# Patient Record
Sex: Female | Born: 1975 | Race: White | Hispanic: No | Smoking: Never smoker
Health system: Southern US, Community
[De-identification: ages and names within clinical notes are randomized; demographics above are authoritative.]

## PROBLEM LIST (undated history)

## (undated) DIAGNOSIS — J45909 Unspecified asthma, uncomplicated: Secondary | ICD-10-CM

## (undated) DIAGNOSIS — G4730 Sleep apnea, unspecified: Secondary | ICD-10-CM

## (undated) DIAGNOSIS — F419 Anxiety disorder, unspecified: Secondary | ICD-10-CM

## (undated) DIAGNOSIS — E785 Hyperlipidemia, unspecified: Secondary | ICD-10-CM

## (undated) DIAGNOSIS — I10 Essential (primary) hypertension: Secondary | ICD-10-CM

## (undated) DIAGNOSIS — T7840XA Allergy, unspecified, initial encounter: Secondary | ICD-10-CM

## (undated) DIAGNOSIS — E079 Disorder of thyroid, unspecified: Secondary | ICD-10-CM

## (undated) HISTORY — DX: Hyperlipidemia, unspecified: E78.5

## (undated) HISTORY — DX: Unspecified asthma, uncomplicated: J45.909

## (undated) HISTORY — DX: Essential (primary) hypertension: I10

## (undated) HISTORY — DX: Sleep apnea, unspecified: G47.30

## (undated) HISTORY — DX: Allergy, unspecified, initial encounter: T78.40XA

---

## 2015-05-01 ENCOUNTER — Encounter (HOSPITAL_COMMUNITY): Payer: Self-pay | Admitting: *Deleted

## 2015-05-01 ENCOUNTER — Emergency Department (INDEPENDENT_AMBULATORY_CARE_PROVIDER_SITE_OTHER)
Admission: EM | Admit: 2015-05-01 | Discharge: 2015-05-01 | Disposition: A | Discharge status: Home / Self Care | Payer: BLUE CROSS/BLUE SHIELD | Source: Home / Self Care | Attending: Family Medicine | Admitting: Family Medicine

## 2015-05-01 DIAGNOSIS — Z760 Encounter for issue of repeat prescription: Secondary | ICD-10-CM | POA: Diagnosis not present

## 2015-05-01 HISTORY — DX: Disorder of thyroid, unspecified: E07.9

## 2015-05-01 HISTORY — DX: Anxiety disorder, unspecified: F41.9

## 2015-05-01 MED ORDER — VENLAFAXINE HCL ER 75 MG PO CP24: 225.0000 mg | ORAL_CAPSULE | Freq: Every day | ORAL | Status: DC

## 2015-05-01 NOTE — ED Notes (Addendum)
Pt  Presents  With  An empty  Bottle  Of effexor   75 mg  3   In am   Er         Requesting a  refill

## 2015-05-01 NOTE — ED Provider Notes (Signed)
CSN: [id number]     Arrival date & time 05/01/15  1854 History   First MD Initiated Contact with Patient 05/01/15 1923     Chief Complaint  Patient presents with  . Medication Refill   (Consider location/radiation/quality/duration/timing/severity/associated sxs/prior Treatment) Patient is a 39 y.o. female presenting with general illness. The history is provided by the patient.  Illness Severity:  Mild Chronicity:  Chronic Context:  Has run out of effexor prescribed [facility] . been on it for sev yrs., diong well.   Past Medical History  Diagnosis Date  . Anxiety   . Thyroid disease    History reviewed. No pertinent past surgical history. History reviewed. No pertinent family history. History  Substance Use Topics  . Smoking status: Never Smoker   . Smokeless tobacco: Not on file  . Alcohol Use: Yes   OB History    No data available     Review of Systems  Constitutional: Negative.   Psychiatric/Behavioral: Negative.     Allergies  Review of patient's allergies indicates no known allergies.  Home Medications   Prior to Admission medications   Medication Sig Start Date End Date Taking? Authorizing Provider  venlafaxine XR (EFFEXOR-XR) 75 MG 24 hr capsule Take 3 capsules (225 mg total) by mouth daily with breakfast. 05/01/15   Billy Fischer, MD   BP 130/72 mmHg  Pulse 78  Temp(Src) 98.6 F (37 C) (Oral)  Resp 18  SpO2 100% Physical Exam  Constitutional: She is oriented to person, place, and time. She appears well-developed and well-nourished. No distress.  Neurological: She is alert and oriented to person, place, and time.  Skin: Skin is warm and dry.  Nursing note and vitals reviewed.   ED Course  Procedures (including critical care time) Labs Review Labs Reviewed - No data to display  Imaging Review No results found.   MDM   1. Encounter for medication refill        Billy Fischer, MD 05/01/15 Joen Laura

## 2015-05-01 NOTE — Discharge Instructions (Signed)
See your doctor for further refills of your medicine.

## 2020-09-28 ENCOUNTER — Telehealth (HOSPITAL_COMMUNITY): Payer: Self-pay | Admitting: Emergency Medicine

## 2020-09-28 ENCOUNTER — Other Ambulatory Visit: Payer: Self-pay | Admitting: Nurse Practitioner

## 2020-09-28 DIAGNOSIS — U071 COVID-19: Secondary | ICD-10-CM

## 2020-09-28 NOTE — Telephone Encounter (Signed)
Called pt and explained possible monoclonal antibody treatment. Pt is not vaccinated. Pt lives in [place]. Sx started 12/20. Tested positive at home 12/22. Sx include SOB (does not have a pulse ox), chest tightness "like I can't get a good breath," cough, nasal congestion, lethargy, and body aches. She said she does not feel as if her SOB is an emergent situation. Said her nailbeds are pink, and she does not sound SOB on the phone. Qualifying risk factors include asthma, hx bronchitis, intermittent HTN, and BMI 37.8. Pt interested in tx. Informed pt an APP will call back to possibly schedule an appointment. Gave pt insurance CPT code [phone] to call insurance company regarding coverage.

## 2020-09-28 NOTE — Progress Notes (Signed)
I connected by phone with Wendy Pratt on 09/28/2020 at 1:04 PM to discuss the potential use of a new treatment for mild to moderate COVID-19 viral infection in non-hospitalized patients.  This patient is a 44 y.o. female that meets the FDA criteria for Emergency Use Authorization of COVID monoclonal antibody casirivimab/imdevimab, bamlanivimab/etesevimab, or sotrovimab.  Has a (+) direct SARS-CoV-2 viral test result  Has mild or moderate COVID-19   Is NOT hospitalized due to COVID-19  Is within 10 days of symptom onset  Has at least one of the high risk factor(s) for progression to severe COVID-19 and/or hospitalization as defined in EUA.  Specific high risk criteria : BMI > 25, Cardiovascular disease or hypertension and Chronic Lung Disease   I have spoken and communicated the following to the patient or parent/caregiver regarding COVID monoclonal antibody treatment:  1. FDA has authorized the emergency use for the treatment of mild to moderate COVID-19 in adults and pediatric patients with positive results of direct SARS-CoV-2 viral testing who are 74 years of age and older weighing at least 40 kg, and who are at high risk for progressing to severe COVID-19 and/or hospitalization.  2. The significant known and potential risks and benefits of COVID monoclonal antibody, and the extent to which such potential risks and benefits are unknown.  3. Information on available alternative treatments and the risks and benefits of those alternatives, including clinical trials.  4. Patients treated with COVID monoclonal antibody should continue to self-isolate and use infection control measures (e.g., wear mask, isolate, social distance, avoid sharing personal items, clean and disinfect "high touch" surfaces, and frequent handwashing) according to CDC guidelines.   5. The patient or parent/caregiver has the option to accept or refuse COVID monoclonal antibody treatment.  After reviewing this  information with the patient, the patient has agreed to receive one of the available covid 19 monoclonal antibodies and will be provided an appropriate fact sheet prior to infusion. Jobe Gibbon, NP 09/28/2020 1:04 PM

## 2020-09-29 ENCOUNTER — Ambulatory Visit (HOSPITAL_COMMUNITY)
Admission: RE | Admit: 2020-09-29 | Discharge: 2020-09-29 | Disposition: A | Discharge status: Home / Self Care | Payer: BC Managed Care – PPO | Source: Ambulatory Visit | Attending: Pulmonary Disease | Admitting: Pulmonary Disease

## 2020-09-29 DIAGNOSIS — U071 COVID-19: Secondary | ICD-10-CM | POA: Diagnosis not present

## 2020-09-29 MED ORDER — EPINEPHRINE 0.3 MG/0.3ML IJ SOAJ: 0.3000 mg | Freq: Once | INTRAMUSCULAR | Status: DC | PRN

## 2020-09-29 MED ORDER — SODIUM CHLORIDE 0.9 % IV SOLN: Freq: Once | INTRAVENOUS | Status: AC

## 2020-09-29 MED ORDER — ALBUTEROL SULFATE HFA 108 (90 BASE) MCG/ACT IN AERS: 2.0000 | INHALATION_SPRAY | Freq: Once | RESPIRATORY_TRACT | Status: DC | PRN

## 2020-09-29 MED ORDER — FAMOTIDINE IN NACL 20-0.9 MG/50ML-% IV SOLN: 20.0000 mg | Freq: Once | INTRAVENOUS | Status: DC | PRN

## 2020-09-29 MED ORDER — DIPHENHYDRAMINE HCL 50 MG/ML IJ SOLN: 50.0000 mg | Freq: Once | INTRAMUSCULAR | Status: DC | PRN

## 2020-09-29 MED ORDER — METHYLPREDNISOLONE SODIUM SUCC 125 MG IJ SOLR: 125.0000 mg | Freq: Once | INTRAMUSCULAR | Status: DC | PRN

## 2020-09-29 MED ORDER — SODIUM CHLORIDE 0.9 % IV SOLN: INTRAVENOUS | Status: DC | PRN

## 2020-09-29 NOTE — Discharge Instructions (Signed)
10 Things You Can Do to Manage Your COVID-19 Symptoms at Home If you have possible or confirmed COVID-19: 1. Stay home from work and school. And stay away from other public places. If you must go out, avoid using any kind of public transportation, ridesharing, or taxis. 2. Monitor your symptoms carefully. If your symptoms get worse, call your healthcare provider immediately. 3. Get rest and stay hydrated. 4. If you have a medical appointment, call the healthcare provider ahead of time and tell them that you have or may have COVID-19. 5. For medical emergencies, call 911 and notify the dispatch personnel that you have or may have COVID-19. 6. Cover your cough and sneezes with a tissue or use the inside of your elbow. 7. Wash your hands often with soap and water for at least 20 seconds or clean your hands with an alcohol-based hand sanitizer that contains at least 60% alcohol. 8. As much as possible, stay in a specific room and away from other people in your home. Also, you should use a separate bathroom, if available. If you need to be around other people in or outside of the home, wear a mask. 9. Avoid sharing personal items with other people in your household, like dishes, towels, and bedding. 10. Clean all surfaces that are touched often, like counters, tabletops, and doorknobs. Use household cleaning sprays or wipes according to the label instructions. cdc.gov/coronavirus 04/08/2019 This information is not intended to replace advice given to you by your health care provider. Make sure you discuss any questions you have with your health care provider. Document Revised: 09/10/2019 Document Reviewed: 09/10/2019 Elsevier Patient Education  2020 Elsevier Inc. What types of side effects do monoclonal antibody drugs cause?  Common side effects  In general, the more common side effects caused by monoclonal antibody drugs include: . Allergic reactions, such as hives or itching . Flu-like signs and  symptoms, including chills, fatigue, fever, and muscle aches and pains . Nausea, vomiting . Diarrhea . Skin rashes . Low blood pressure   The CDC is recommending patients who receive monoclonal antibody treatments wait at least 90 days before being vaccinated.  Currently, there are no data on the safety and efficacy of mRNA COVID-19 vaccines in persons who received monoclonal antibodies or convalescent plasma as part of COVID-19 treatment. Based on the estimated half-life of such therapies as well as evidence suggesting that reinfection is uncommon in the 90 days after initial infection, vaccination should be deferred for at least 90 days, as a precautionary measure until additional information becomes available, to avoid interference of the antibody treatment with vaccine-induced immune responses. If you have any questions or concerns after the infusion please call the Advanced Practice Provider on call at [phone]. This number is ONLY intended for your use regarding questions or concerns about the infusion post-treatment side-effects.  Please do not provide this number to others for use. For return to work notes please contact your primary care provider.   If someone you know is interested in receiving treatment please have them call the COVID hotline at [phone].   

## 2020-09-29 NOTE — Progress Notes (Addendum)
Patient reviewed Fact Sheet for Patients, Parents, and Caregivers for Emergency Use Authorization (EUA) of casirivimab/imdevimab for the Treatment of Coronavirus.  Patient also reviewed and is agreeable to the estimated cost of treatment.  Patient is agreeable to proceed.  

## 2020-09-29 NOTE — Progress Notes (Addendum)
Note:  Wendy Pratt had mild "burning sensation" 3/10 approximately 12:15 pm. Notes mild shortness of breath.  Hx of asthma; nebulizer last used last night. She took some breaths and feels much better; believes she's having some anxiety wearing the mask, as she's not used to it. She states she feels okay to drive home and will take nebulizer treatment; does not have inhaler on hand.    Diagnosis: COVID-19  Physician:  Dr. Asencion Noble   Procedure: [facility] Med: casirivimab\imdevimab infusion - Provided patient with casirivimab\imdevimab fact sheet for patients, parents and caregivers prior to infusion.  Complications: No immediate complications noted.  Discharge: Discharged home   Monna Fam 09/29/2020

## 2021-10-30 ENCOUNTER — Emergency Department (HOSPITAL_BASED_OUTPATIENT_CLINIC_OR_DEPARTMENT_OTHER): Payer: BC Managed Care – PPO

## 2021-10-30 ENCOUNTER — Other Ambulatory Visit: Payer: Self-pay

## 2021-10-30 ENCOUNTER — Other Ambulatory Visit (HOSPITAL_BASED_OUTPATIENT_CLINIC_OR_DEPARTMENT_OTHER): Payer: Self-pay

## 2021-10-30 ENCOUNTER — Encounter (HOSPITAL_BASED_OUTPATIENT_CLINIC_OR_DEPARTMENT_OTHER): Payer: Self-pay | Admitting: *Deleted

## 2021-10-30 ENCOUNTER — Emergency Department (HOSPITAL_BASED_OUTPATIENT_CLINIC_OR_DEPARTMENT_OTHER)
Admission: EM | Admit: 2021-10-30 | Discharge: 2021-10-30 | Disposition: A | Discharge status: Home / Self Care | Payer: BC Managed Care – PPO | Attending: Emergency Medicine | Admitting: Emergency Medicine

## 2021-10-30 DIAGNOSIS — R0789 Other chest pain: Secondary | ICD-10-CM | POA: Insufficient documentation

## 2021-10-30 DIAGNOSIS — E039 Hypothyroidism, unspecified: Secondary | ICD-10-CM | POA: Insufficient documentation

## 2021-10-30 DIAGNOSIS — K625 Hemorrhage of anus and rectum: Secondary | ICD-10-CM

## 2021-10-30 DIAGNOSIS — R1084 Generalized abdominal pain: Secondary | ICD-10-CM | POA: Insufficient documentation

## 2021-10-30 DIAGNOSIS — K649 Unspecified hemorrhoids: Secondary | ICD-10-CM

## 2021-10-30 DIAGNOSIS — K644 Residual hemorrhoidal skin tags: Secondary | ICD-10-CM | POA: Insufficient documentation

## 2021-10-30 DIAGNOSIS — R109 Unspecified abdominal pain: Secondary | ICD-10-CM | POA: Diagnosis present

## 2021-10-30 LAB — COMPREHENSIVE METABOLIC PANEL
ALT: 46 U/L — ABNORMAL HIGH (ref 0–44)
AST: 30 U/L (ref 15–41)
Albumin: 4.1 g/dL (ref 3.5–5.0)
Alkaline Phosphatase: 70 U/L (ref 38–126)
Anion gap: 9 (ref 5–15)
BUN: 13 mg/dL (ref 6–20)
CO2: 24 mmol/L (ref 22–32)
Calcium: 8.7 mg/dL — ABNORMAL LOW (ref 8.9–10.3)
Chloride: 103 mmol/L (ref 98–111)
Creatinine, Ser: 0.72 mg/dL (ref 0.44–1.00)
GFR, Estimated: >60 mL/min (ref >60)
Glucose, Bld: 93 mg/dL (ref 70–99)
Potassium: 3.7 mmol/L (ref 3.5–5.1)
Sodium: 136 mmol/L (ref 135–145)
Total Bilirubin: 0.8 mg/dL (ref 0.3–1.2)
Total Protein: 6.8 g/dL (ref 6.5–8.1)

## 2021-10-30 LAB — URINALYSIS, ROUTINE W REFLEX MICROSCOPIC
Bilirubin Urine: NEGATIVE
Glucose, UA: NEGATIVE mg/dL
Hgb urine dipstick: NEGATIVE
Ketones, ur: NEGATIVE mg/dL
Leukocytes,Ua: NEGATIVE
Nitrite: NEGATIVE
Protein, ur: NEGATIVE mg/dL
Specific Gravity, Urine: 1.015 (ref 1.005–1.030)
pH: 6.5 (ref 5.0–8.0)

## 2021-10-30 LAB — CBC WITH DIFFERENTIAL/PLATELET
Abs Immature Granulocytes: 0.02 10*3/uL (ref 0.00–0.07)
Basophils Absolute: 0.1 10*3/uL (ref 0.0–0.1)
Basophils Relative: 1 %
Eosinophils Absolute: 0.1 10*3/uL (ref 0.0–0.5)
Eosinophils Relative: 1 %
HCT: 36.1 % (ref 36.0–46.0)
Hemoglobin: 12.8 g/dL (ref 12.0–15.0)
Immature Granulocytes: 0 %
Lymphocytes Relative: 28 %
Lymphs Abs: 1.7 10*3/uL (ref 0.7–4.0)
MCH: 31 pg (ref 26.0–34.0)
MCHC: 35.5 g/dL (ref 30.0–36.0)
MCV: 87.4 fL (ref 80.0–100.0)
Monocytes Absolute: 0.6 10*3/uL (ref 0.1–1.0)
Monocytes Relative: 10 %
Neutro Abs: 3.7 10*3/uL (ref 1.7–7.7)
Neutrophils Relative %: 60 %
Platelets: 262 10*3/uL (ref 150–400)
RBC: 4.13 MIL/uL (ref 3.87–5.11)
RDW: 12.1 % (ref 11.5–15.5)
WBC: 6.2 10*3/uL (ref 4.0–10.5)
nRBC: 0 % (ref 0.0–0.2)

## 2021-10-30 LAB — PREGNANCY, URINE: Preg Test, Ur: NEGATIVE

## 2021-10-30 LAB — LIPASE, BLOOD: Lipase: 33 U/L (ref 11–51)

## 2021-10-30 LAB — OCCULT BLOOD X 1 CARD TO LAB, STOOL: Fecal Occult Bld: POSITIVE — AB

## 2021-10-30 MED ORDER — DICYCLOMINE HCL 20 MG PO TABS
20.0000 mg | ORAL_TABLET | Freq: Three times a day (TID) | ORAL | 0 refills | Status: DC | PRN
  Filled 2021-10-30: qty 20, 7d supply, fill #0

## 2021-10-30 MED ORDER — SODIUM CHLORIDE 0.9 % IV BOLUS
1000.0000 mL | Freq: Once | INTRAVENOUS | Status: AC
  Administered 2021-10-30: 1000 mL via INTRAVENOUS

## 2021-10-30 MED ORDER — KETOROLAC TROMETHAMINE 30 MG/ML IJ SOLN
30.0000 mg | Freq: Once | INTRAMUSCULAR | Status: AC
  Administered 2021-10-30: 30 mg via INTRAVENOUS
  Filled 2021-10-30: qty 1

## 2021-10-30 MED ORDER — MORPHINE SULFATE (PF) 4 MG/ML IV SOLN
4.0000 mg | Freq: Once | INTRAVENOUS | Status: AC
  Administered 2021-10-30: 4 mg via INTRAVENOUS
  Filled 2021-10-30: qty 1

## 2021-10-30 MED ORDER — IOHEXOL 350 MG/ML SOLN
100.0000 mL | Freq: Once | INTRAVENOUS | Status: AC | PRN
  Administered 2021-10-30: 125 mL via INTRAVENOUS

## 2021-10-30 MED ORDER — HYDROCODONE-ACETAMINOPHEN 5-325 MG PO TABS
1.0000 | ORAL_TABLET | ORAL | 0 refills | Status: AC | PRN
  Filled 2021-10-30: qty 10, 2d supply, fill #0

## 2021-10-30 MED ORDER — ONDANSETRON HCL 4 MG/2ML IJ SOLN
4.0000 mg | Freq: Once | INTRAMUSCULAR | Status: AC
Start: 2021-10-30 — End: 2021-10-30
  Administered 2021-10-30: 4 mg via INTRAVENOUS
  Filled 2021-10-30: qty 2

## 2021-10-30 MED ORDER — HYDROCORTISONE (PERIANAL) 2.5 % EX CREA
1.0000 "application " | TOPICAL_CREAM | Freq: Two times a day (BID) | CUTANEOUS | 0 refills | Status: AC
  Filled 2021-10-30: qty 28, 10d supply, fill #0

## 2021-10-30 NOTE — ED Provider Notes (Signed)
[facility] EMERGENCY DEPARTMENT Provider Note   CSN: [id number] Arrival date & time: 10/30/21  1233     History  Chief Complaint  Patient presents with   Abdominal Pain   Rectal Bleeding    Wendy Pratt is a 46 y.o. female.  Pt is a 46 yo wf with a hx of anxiety and hypothyroidism.  She said she has had intermittent rectal bleeding due to hemorrhoids for several years.  She said it usually does not hurt.  Yesterday, she started having back pain, abd pain, and increased bleeding.  She said she is getting a lot of cramps.  She has never seen GI or had a colonoscopy.  She is sure the blood is from her rectum.      Home Medications Prior to Admission medications   Medication Sig Start Date End Date Taking? Authorizing Provider  dicyclomine (BENTYL) 20 MG tablet Take 1 tablet (20 mg total) by mouth 3 (three) times daily as needed for spasms. 10/30/21  Yes Isla Pence, MD  DULoxetine (CYMBALTA) 60 MG capsule Take 1 capsule by mouth daily. 05/22/21  Yes [provider]  HYDROcodone-acetaminophen (NORCO/VICODIN) 5-325 MG tablet Take 1 tablet by mouth every 4 (four) hours as needed. 10/30/21  Yes Isla Pence, MD  hydrocortisone (ANUSOL-HC) 2.5 % rectal cream Place 1 application rectally 2 (two) times daily. 10/30/21  Yes Isla Pence, MD  venlafaxine (EFFEXOR) 75 MG tablet Take 75 mg by mouth 3 (three) times daily with meals.    [provider]  venlafaxine XR (EFFEXOR-XR) 75 MG 24 hr capsule Take 3 capsules (225 mg total) by mouth daily with breakfast. 05/01/15   Kindl, Nelda Severe, MD      Allergies    Patient has no known allergies.    Review of Systems   Review of Systems  Gastrointestinal:  Positive for blood in stool.  All other systems reviewed and are negative.  Physical Exam Updated Vital Signs BP 111/74 (BP Location: Right Arm)    Pulse 94    Temp 98.2 F (36.8 C) (Oral)    Resp 18    Ht 5\' 4"  (1.626 m)    Wt 127 kg    SpO2 97%    BMI 48.06  kg/m  Physical Exam Vitals and nursing note reviewed. Exam conducted with a chaperone present.  Constitutional:      Appearance: She is well-developed.  HENT:     Head: Normocephalic and atraumatic.     Mouth/Throat:     Mouth: Mucous membranes are moist.     Pharynx: Oropharynx is clear.  Eyes:     Extraocular Movements: Extraocular movements intact.     Pupils: Pupils are equal, round, and reactive to light.  Cardiovascular:     Rate and Rhythm: Normal rate and regular rhythm.     Heart sounds: Normal heart sounds.  Pulmonary:     Effort: Pulmonary effort is normal.     Breath sounds: Normal breath sounds.  Abdominal:     General: Abdomen is flat. Bowel sounds are normal.     Palpations: Abdomen is soft.     Tenderness: There is generalized abdominal tenderness.  Genitourinary:    Rectum: Guaiac result positive. Tenderness and external hemorrhoid present.  Skin:    General: Skin is warm.     Capillary Refill: Capillary refill takes less than 2 seconds.  Neurological:     General: No focal deficit present.     Mental Status: She is alert and  oriented to person, place, and time.  Psychiatric:        Mood and Affect: Mood normal.        Behavior: Behavior normal.    ED Results / Procedures / Treatments   Labs (all labs ordered are listed, but only abnormal results are displayed) Labs Reviewed  COMPREHENSIVE METABOLIC PANEL - Abnormal; Notable for the following components:      Result Value   Calcium 8.7 (*)    ALT 46 (*)    All other components within normal limits  OCCULT BLOOD X 1 CARD TO LAB, STOOL - Abnormal; Notable for the following components:   Fecal Occult Bld POSITIVE (*)    All other components within normal limits  CBC WITH DIFFERENTIAL/PLATELET  LIPASE, BLOOD  URINALYSIS, ROUTINE W REFLEX MICROSCOPIC  PREGNANCY, URINE    EKG None  Radiology CT Angio Abd/Pel W and/or Wo Contrast  Result Date: 10/30/2021 CLINICAL DATA:  46 year old female with  history of abdominal pain after episode of rectal bleeding. EXAM: CT ANGIOGRAPHY ABDOMEN AND PELVIS WITH CONTRAST AND WITHOUT CONTRAST TECHNIQUE: Multidetector CT imaging of the abdomen and pelvis was performed using the standard protocol during bolus administration of intravenous contrast. Multiplanar reconstructed images and MIPs were obtained and reviewed to evaluate the vascular anatomy. RADIATION DOSE REDUCTION: This exam was performed according to the departmental dose-optimization program which includes automated exposure control, adjustment of the mA and/or kV according to patient size and/or use of iterative reconstruction technique. CONTRAST:  167mL OMNIPAQUE IOHEXOL 350 MG/ML SOLN COMPARISON:  03/15/2021 FINDINGS: VASCULAR Aorta: Normal caliber aorta without aneurysm, dissection, vasculitis or significant stenosis. Celiac: Patent without evidence of aneurysm, dissection, vasculitis or significant stenosis. SMA: Patent without evidence of aneurysm, dissection, vasculitis or significant stenosis. Renals: Single bilateral renal arteries are patent without evidence of aneurysm, dissection, vasculitis, fibromuscular dysplasia or significant stenosis. IMA: Patent without evidence of aneurysm, dissection, vasculitis or significant stenosis. Inflow: Patent without evidence of aneurysm, dissection, vasculitis or significant stenosis. Proximal Outflow: Bilateral common femoral and visualized portions of the superficial and profunda femoral arteries are patent without evidence of aneurysm, dissection, vasculitis or significant stenosis. Veins: The hepatic veins are widely patent. The portal system is widely patent and normal in caliber. The renal veins are patent bilaterally in standard anatomic configuration. No evidence of iliocaval thrombosis or anomaly. Review of the MIP images confirms the above findings. NON-VASCULAR Lower chest: No acute abnormality. Hepatobiliary: Profound lead decreased attenuation of the  hepatic parenchyma. Smooth contour. No evidence of hepatoma. The gallbladder surgically absent. Pancreas: Unremarkable. No pancreatic ductal dilatation or surrounding inflammatory changes. Spleen: No splenic injury or perisplenic hematoma. Adrenals/Urinary Tract: Adrenal glands are unremarkable. Kidneys are normal, without renal calculi, focal lesion, or hydronephrosis. Bladder is unremarkable. Stomach/Bowel: Stomach is within normal limits. Appendix appears normal. No evidence of bowel wall thickening, distention, or inflammatory changes. Lymphatic: No abdominopelvic lymphadenopathy. Reproductive: Uterus and bilateral adnexa are within normal limits. Intrauterine device in place which appears well positioned. Other: No abdominal wall hernia or abnormality. No abdominopelvic ascites. Musculoskeletal: No acute or significant osseous findings. IMPRESSION: VASCULAR No evidence of acute gastrointestinal hemorrhage. NON-VASCULAR 1. Severe hepatic steatosis. 2. No acute abdominopelvic abnormality. Ruthann Cancer, MD [facility] Radiology Electronically Signed   By: Ruthann Cancer M.D.   On: 10/30/2021 14:55    Procedures Procedures    Medications Ordered in ED Medications  sodium chloride 0.9 % bolus 1,000 mL (1,000 mLs Intravenous New Bag/Given 10/30/21 1343)  ondansetron (ZOFRAN) injection 4 mg (4 mg Intravenous Given 10/30/21 1341)  ketorolac (TORADOL) 30 MG/ML injection 30 mg (30 mg Intravenous Given 10/30/21 1342)  iohexol (OMNIPAQUE) 350 MG/ML injection 100 mL (125 mLs Intravenous Contrast Given 10/30/21 1430)  morphine 4 MG/ML injection 4 mg (4 mg Intravenous Given 10/30/21 1425)    ED Course/ Medical Decision Making/ A&P                           Medical Decision Making Amount and/or Complexity of Data Reviewed Labs: ordered. Radiology: ordered.  Risk Prescription drug management.   Pt has large external hemorrhoids and I suspect she also has  internal hemorrhoids.  However, with the abd pain; I ordered a ct abd/pelvis.  Labs and scans were reviewed by me.  Labs are all normal.  Pt's CT showed a fatty liver, but no evidence of acute gi hemorrhage.   Pt is referred to GI.  She is started on anusol for the hemorrhoids.  She is put on bentyl and lortab for the abd pain.  She is to increase fiber in diet.    Pt is stable for d/c.  Return if worse.        Final Clinical Impression(s) / ED Diagnoses Final diagnoses:  Rectal bleeding  Generalized abdominal pain  Hemorrhoids, unspecified hemorrhoid type    Rx / DC Orders ED Discharge Orders          Ordered    Ambulatory referral to Gastroenterology        10/30/21 1511    hydrocortisone (ANUSOL-HC) 2.5 % rectal cream  2 times daily        10/30/21 1512    dicyclomine (BENTYL) 20 MG tablet  3 times daily PRN        10/30/21 1512    HYDROcodone-acetaminophen (NORCO/VICODIN) 5-325 MG tablet  Every 4 hours PRN        10/30/21 1512              Isla Pence, MD 10/30/21 [phone]

## 2021-10-30 NOTE — ED Triage Notes (Signed)
Rectal bleeding. Hx of hemorrhoids. Back pain yesterday. This am she had bright red blood in the toilet. Abdominal pain this am after an episode of bleeding.

## 2021-11-01 ENCOUNTER — Encounter: Payer: Self-pay | Admitting: Internal Medicine

## 2021-11-01 ENCOUNTER — Ambulatory Visit (INDEPENDENT_AMBULATORY_CARE_PROVIDER_SITE_OTHER): Payer: BC Managed Care – PPO | Admitting: Internal Medicine

## 2021-11-01 VITALS — BP 122/76 | HR 73 | Ht 64.0 in | Wt 281.0 lb

## 2021-11-01 DIAGNOSIS — K6289 Other specified diseases of anus and rectum: Secondary | ICD-10-CM

## 2021-11-01 DIAGNOSIS — K760 Fatty (change of) liver, not elsewhere classified: Secondary | ICD-10-CM

## 2021-11-01 DIAGNOSIS — K625 Hemorrhage of anus and rectum: Secondary | ICD-10-CM

## 2021-11-01 MED ORDER — RECTICARE ADVANCED 5-0.25-17-39 % EX CREA: TOPICAL_CREAM | CUTANEOUS | 0 refills | Status: AC

## 2021-11-01 NOTE — Progress Notes (Signed)
Chief Complaint: Rectal bleeding, bloating  HPI : 46 year old female with history of asthma, fibromyalgia, OSA, obesity, GERD, anxiety, and hypothyroidism presents with rectal bleeding  She has had intermittent rectal bleeding for about 4 years, but more recently has had more significant rectal bleeding that has been concerning.  She has noted some blood clots with the passage of her stools and sees blood fill her toilet bowl. Endorses rectal discomfort and ab pain that is constant. Her ab pain is mostly located in the RLQ. She has not had a BM for the last 2 days after she left the ED. She went to the ED on 10/30/21 and was told that she had external and internal hemorrhoids. She was encouraged to use Bentyl and hydrocortisone cream, and was also prescribed some Norco, which she has been using PRN. She states that her stools are normally soft and flat appearing. Denies use of blood thinners. Endorses use of meloxicam once a day. She tried the hydrocortisone cream that she feels like has not helped that much. Denies prior colonoscopy . Grandmother had colon caner in her 60-70s. She takes 2 omeprazole every day that helps with her GERD. On average she has one BM per day prior to going to the ED.   Past Medical History:  Diagnosis Date   Anxiety    Thyroid disease      Past Surgical History:  Procedure Laterality Date   BUNIONECTOMY Right    right foot   CHOLECYSTECTOMY     Family History  Problem Relation Age of Onset   Hypothyroidism Mother    High Cholesterol Mother    Colon cancer Maternal Grandmother    Breast cancer Paternal Grandmother    Rectal cancer Neg Hx    Stomach cancer Neg Hx    Esophageal cancer Neg Hx    Pancreatic cancer Neg Hx    Liver disease Neg Hx    Social History   Tobacco Use   Smoking status: Never   Smokeless tobacco: Never  Vaping Use   Vaping Use: Never used  Substance Use Topics   Alcohol use: Not Currently   Drug use: Never   Current  Outpatient Medications  Medication Sig Dispense Refill   Cholecalciferol 25 MCG (1000 UT) tablet Take by mouth.     dicyclomine (BENTYL) 20 MG tablet Take 1 tablet (20 mg total) by mouth 3 (three) times daily as needed for spasms. 20 tablet 0   DULoxetine (CYMBALTA) 60 MG capsule Take 1 capsule by mouth daily.     HYDROcodone-acetaminophen (NORCO/VICODIN) 5-325 MG tablet Take 1 tablet by mouth every 4 (four) hours as needed. 10 tablet 0   hydrocortisone (ANUSOL-HC) 2.5 % rectal cream Place 1 application rectally 2 (two) times daily. 28 g 0   levothyroxine (SYNTHROID) 150 MCG tablet 1 tablet from Monday to Saturday. Take only 1/2 tablet every Sunday.     Lido-PE-Mineral Oil-Petrolatum (RECTICARE ADVANCED) 5-0.25-17-39 % CREA Use as needed 3 g 0   No current facility-administered medications for this visit.   No Known Allergies   Review of Systems: All systems reviewed and negative except where noted in HPI.   Physical Exam: BP 122/76    Pulse 73    Ht 5\' 4"  (1.626 m)    Wt 281 lb (127.5 kg)    SpO2 99%    BMI 48.23 kg/m  Constitutional: Pleasant,well-developed, female in no acute distress. HEENT: Normocephalic and atraumatic. Conjunctivae are normal. No scleral icterus. Cardiovascular: Normal rate,  regular rhythm.  Pulmonary/chest: Effort normal and breath sounds normal. No wheezing, rales or rhonchi. Abdominal: Soft, nondistended, nontender. Bowel sounds active throughout. There are no masses palpable. No hepatomegaly. Rectal: With Recticare, was able to perform inspection. No masses felt on rectal exam. Has some prolapse of internal hemorrhoids. Anoscopy revealed several columns of internal hemorrhoids. Extremities: No edema Neurological: Alert and oriented to person place and time. Skin: Skin is warm and dry. No rashes noted. Psychiatric: Normal mood and affect. Behavior is normal.  Labs 10/2021: CMP with elevated ALT of 46. CBC unremarkable. Lipase nml.  FOBT positive  CTA A/P  10/30/21: IMPRESSION: VASCULAR No evidence of acute gastrointestinal hemorrhage. NON-VASCULAR 1. Severe hepatic steatosis. 2. No acute abdominopelvic abnormality  ASSESSMENT AND PLAN:  Rectal bleeding Rectal pain Lower abdominal pain Fatty liver Patient presents with worsening issues with rectal bleeding recently.  Unclear what is caused her worsening rectal bleeding, as patient does not describe significant constipation at a baseline.  Her rectal exam reveals prolapsed internal hemorrhoids that may account for pain.  However, would want to rule out malignancy or IBD by performing a colonoscopy for further evaluation. - Recticare PRN - Continue Anusol HC cream prescribed by ED - Recommend avoiding use of narcotic medications if possible - Colonoscopy [facility] - RTC 1 month for consideration of hemorrhoidal banding and work up of fatty liver  Christia Reading, MD  I spent 63 minutes of time, including in depth chart review, independent review of results as outlined above, communicating results with the patient directly, face-to-face time with the patient, coordinating care, ordering studies and medications as appropriate, and documentation.

## 2021-11-01 NOTE — Patient Instructions (Addendum)
If you are age 45 or older, your body mass index should be between 23-30. Your Body mass index is 48.23 kg/m. If this is out of the aforementioned range listed, please consider follow up with your Primary Care Provider.  If you are age 79 or younger, your body mass index should be between 19-25. Your Body mass index is 48.23 kg/m. If this is out of the aformentioned range listed, please consider follow up with your Primary Care Provider.   ________________________________________________________  The  GI providers would like to encourage you to use [facility] to communicate with providers for non-urgent requests or questions.  Due to long hold times on the telephone, sending your provider a message by [facility] may be a faster and more efficient way to get a response.  Please allow 48 business hours for a response.  Please remember that this is for non-urgent requests.  _______________________________________________________  Dennis Bast have been scheduled for a colonoscopy on Monday, 1-30 at 4:00 pm. Please follow written instructions given to you at your visit today.  Please pick up your prep supplies at the pharmacy within the next 1-3 days. If you use inhalers (even only as needed), please bring them with you on the day of your procedure.  We are giving you samples of Recticare today to use as needed.  We have scheduled you for a follow up appointment on Thursday, 2-23 at 10:30 am  Thank you for entrusting me with your care and for choosing [facility], Dr. Christia Reading

## 2021-11-06 ENCOUNTER — Ambulatory Visit (AMBULATORY_SURGERY_CENTER): Payer: BC Managed Care – PPO | Admitting: Gastroenterology

## 2021-11-06 ENCOUNTER — Encounter: Payer: Self-pay | Admitting: Gastroenterology

## 2021-11-06 VITALS — BP 103/72 | HR 72 | Temp 98.6°F | Resp 18 | Ht 64.0 in | Wt 281.0 lb

## 2021-11-06 DIAGNOSIS — K635 Polyp of colon: Secondary | ICD-10-CM

## 2021-11-06 DIAGNOSIS — D123 Benign neoplasm of transverse colon: Secondary | ICD-10-CM

## 2021-11-06 DIAGNOSIS — K625 Hemorrhage of anus and rectum: Secondary | ICD-10-CM

## 2021-11-06 DIAGNOSIS — K648 Other hemorrhoids: Secondary | ICD-10-CM

## 2021-11-06 MED ORDER — SODIUM CHLORIDE 0.9 % IV SOLN: 500.0000 mL | Freq: Once | INTRAVENOUS | Status: DC

## 2021-11-06 MED ORDER — HYDROCORTISONE ACETATE 25 MG RE SUPP: 25.0000 mg | Freq: Two times a day (BID) | RECTAL | 1 refills | Status: AC

## 2021-11-06 NOTE — Progress Notes (Signed)
History and Physical Interval Note: Patient seen in the office 11/01/21 for rectal bleeding / pain, patient of Dr. Lorenso Courier. Bleeding intermittently for some years thought to be due to hemorrhoids per patient, more recently bleeding got worse with anal / rectal pain. Denies much straining. Colonoscopy to further evaluate. Have discussed risks / benefits and she wishes to proceed. Last seen in the office 11/01/21, no interval changes otherwise.   11/06/2021 10:47 AM  Wendy Pratt  has presented today for endoscopic procedure(s), with the diagnosis of  Encounter Diagnosis  Name Primary?   Rectal bleeding Yes  .  The various methods of evaluation and treatment have been discussed with the patient and/or family. After consideration of risks, benefits and other options for treatment, the patient has consented to  the endoscopic procedure(s).   The patient's history has been reviewed, patient examined, no change in status, stable for surgery.  I have reviewed the patient's chart and labs.  Questions were answered to the patient's satisfaction.    Jolly Mango, MD [facility]

## 2021-11-06 NOTE — Patient Instructions (Addendum)
Handouts on polys, and Hemorrhoids provided   Start Anusol suppository twice daily for one week  - Start fiber supplement daily (Citrucel or Metamucil)  - Sitz baths as needed  - Await pathology results.  - Follow up in the office with Dr. Lorenso Courier for consideration of hemorrhoid banding pending patient's course YOU HAD AN ENDOSCOPIC PROCEDURE TODAY AT [facility]:   Refer to the procedure report that was given to you for any specific questions about what was found during the examination.  If the procedure report does not answer your questions, please call your gastroenterologist to clarify.  If you requested that your care partner not be given the details of your procedure findings, then the procedure report has been included in a sealed envelope for you to review at your convenience later.  YOU SHOULD EXPECT: Some feelings of bloating in the abdomen. Passage of more gas than usual.  Walking can help get rid of the air that was put into your GI tract during the procedure and reduce the bloating. If you had a lower endoscopy (such as a colonoscopy or flexible sigmoidoscopy) you may notice spotting of blood in your stool or on the toilet paper. If you underwent a bowel prep for your procedure, you may not have a normal bowel movement for a few days.  Please Note:  You might notice some irritation and congestion in your nose or some drainage.  This is from the oxygen used during your procedure.  There is no need for concern and it should clear up in a day or so.  SYMPTOMS TO REPORT IMMEDIATELY:  Following lower endoscopy (colonoscopy or flexible sigmoidoscopy):  Excessive amounts of blood in the stool  Significant tenderness or worsening of abdominal pains  Swelling of the abdomen that is new, acute  Fever of 100F or higher  For urgent or emergent issues, a gastroenterologist can be reached at any hour by calling [phone]. Do not use MyChart messaging for urgent  concerns.    DIET:  We do recommend a small meal at first, but then you may proceed to your regular diet.  Drink plenty of fluids but you should avoid alcoholic beverages for 24 hours.  ACTIVITY:  You should plan to take it easy for the rest of today and you should NOT DRIVE or use heavy machinery until tomorrow (because of the sedation medicines used during the test).    FOLLOW UP: Our staff will call the number listed on your records 48-72 hours following your procedure to check on you and address any questions or concerns that you may have regarding the information given to you following your procedure. If we do not reach you, we will leave a message.  We will attempt to reach you two times.  During this call, we will ask if you have developed any symptoms of COVID 19. If you develop any symptoms (ie: fever, flu-like symptoms, shortness of breath, cough etc.) before then, please call [phone].  If you test positive for Covid 19 in the 2 weeks post procedure, please call and report this information to [place].    If any biopsies were taken you will be contacted by phone or by letter within the next 1-3 weeks.  Please call us at [phone] if you have not heard about the biopsies in 3 weeks.    SIGNATURES/CONFIDENTIALITY: You and/or your care partner have signed paperwork which will be entered into your electronic medical record.  These signatures attest to  the fact that that the information above on your After Visit Summary has been reviewed and is understood.  Full responsibility of the confidentiality of this discharge information lies with you and/or your care-partner.

## 2021-11-06 NOTE — Progress Notes (Signed)
Robinul and Zofran given d/t extended abd pressure need to stay in cecum.  Approx 10 min.  Pt did require minimal suctioning.  Fluid clear and less than 50cc

## 2021-11-06 NOTE — Progress Notes (Signed)
Called to room to assist during endoscopic procedure.  Patient ID and intended procedure confirmed with present staff. Received instructions for my participation in the procedure from the performing physician.  

## 2021-11-06 NOTE — Progress Notes (Signed)
Report to PACU, RN, vss, BBS= Clear.  

## 2021-11-06 NOTE — Op Note (Signed)
[facility] Patient Name: Wendy Pratt Procedure Date: 11/06/2021 10:31 AM MRN: [id number] Endoscopist: Remo Lipps P. Havery Moros , MD Age: 46 Referring MD:  Date of Birth: December 27, 1975 Gender: Female Account #: [id number] Procedure:                Colonoscopy Indications:              Rectal bleeding - Dr. Lorenso Courier primary GI MD Medicines:                Monitored Anesthesia Care Procedure:                Pre-Anesthesia Assessment:                           - Prior to the procedure, a History and Physical                            was performed, and patient medications and                            allergies were reviewed. The patient's tolerance of                            previous anesthesia was also reviewed. The risks                            and benefits of the procedure and the sedation                            options and risks were discussed with the patient.                            All questions were answered, and informed consent                            was obtained. Prior Anticoagulants: The patient has                            taken no previous anticoagulant or antiplatelet                            agents. ASA Grade Assessment: III - A patient with                            severe systemic disease. After reviewing the risks                            and benefits, the patient was deemed in                            satisfactory condition to undergo the procedure.                           After obtaining informed consent, the colonoscope  was passed under direct vision. Throughout the                            procedure, the patient's blood pressure, pulse, and                            oxygen saturations were monitored continuously. The                            Colonoscope was introduced through the anus and                            advanced to the the cecum, identified by                            appendiceal orifice  and ileocecal valve. The                            colonoscopy was performed without difficulty. The                            patient tolerated the procedure well. The quality                            of the bowel preparation was adequate. The                            ileocecal valve, appendiceal orifice, and rectum                            were photographed. Scope In: 10:57:32 AM Scope Out: 11:24:28 AM Scope Withdrawal Time: 0 hours 21 minutes 36 seconds  Total Procedure Duration: 0 hours 26 minutes 56 seconds  Findings:                 Hemorrhoids were found on perianal exam.                           A 4 mm polyp was found in the transverse colon. The                            polyp was sessile. The polyp was removed with a                            cold snare. Resection and retrieval were complete.                           The colon revealed excessive looping, cecal                            intubation required use of abdominal pressure.                            Cecal was folded back on itself with residual  stool, hard to clear back side of IC valve                            initially but eventually visualized and normal. .                           Internal hemorrhoids were found during retroflexion.                           The exam was otherwise without abnormality. Complications:            No immediate complications. Estimated blood loss:                            Minimal. Estimated Blood Loss:     Estimated blood loss was minimal. Impression:               - Hemorrhoids found on perianal exam.                           - One 4 mm polyp in the transverse colon, removed                            with a cold snare. Resected and retrieved.                           - There was looping of the colon.                           - Internal hemorrhoids.                           - The examination was otherwise normal.                            Hemorrhoids are the cause of rectal bleeding.                            Patient had had difficulty placing lidocaine /                            steroid cream PR, would switch to anusol                            suppositories. Patient is a candidate for                            hemorrhoid banding, given chronicity of symptoms                            this would be best for long term management. Recommendation:           - Patient has a contact number available for                            emergencies.  The signs and symptoms of potential                            delayed complications were discussed with the                            patient. Return to normal activities tomorrow.                            Written discharge instructions were provided to the                            patient.                           - Resume previous diet.                           - Continue present medications.                           - Start Anusol suppository twice daily for one week                           - Start fiber supplement daily (Citrucel or                            Metamucil)                           - Sitz baths as needed                           - Await pathology results.                           - Follow up in the office with Dr. Lorenso Courier for                            consideration of hemorrhoid banding pending                            patient's course Carlota Raspberry. Hani Campusano, MD 11/06/2021 11:34:00 AM This report has been signed electronically.

## 2021-11-08 ENCOUNTER — Telehealth: Payer: Self-pay

## 2021-11-08 NOTE — Telephone Encounter (Signed)
°  Follow up Call-  Call back number 11/06/2021  Post procedure Call Back phone  # [phone]  Permission to leave phone message Yes  Some recent data might be hidden     Patient questions:  Do you have a fever, pain , or abdominal swelling? No. Pain Score  0 *  Have you tolerated food without any problems? Yes.    Have you been able to return to your normal activities? Yes.    Do you have any questions about your discharge instructions: Diet   No. Medications  No. Follow up visit  No.  Do you have questions or concerns about your Care? No.  Actions: * If pain score is 4 or above: No action needed, pain <4.

## 2021-11-30 ENCOUNTER — Other Ambulatory Visit (INDEPENDENT_AMBULATORY_CARE_PROVIDER_SITE_OTHER): Payer: BC Managed Care – PPO

## 2021-11-30 ENCOUNTER — Encounter: Payer: Self-pay | Admitting: Internal Medicine

## 2021-11-30 ENCOUNTER — Ambulatory Visit: Payer: BC Managed Care – PPO | Admitting: Internal Medicine

## 2021-11-30 VITALS — BP 110/80 | HR 81 | Ht 64.0 in | Wt 278.0 lb

## 2021-11-30 DIAGNOSIS — K649 Unspecified hemorrhoids: Secondary | ICD-10-CM

## 2021-11-30 DIAGNOSIS — K760 Fatty (change of) liver, not elsewhere classified: Secondary | ICD-10-CM | POA: Diagnosis not present

## 2021-11-30 LAB — IBC + FERRITIN
Ferritin: 104 ng/mL (ref 10.0–291.0)
Iron: 74 ug/dL (ref 42–145)
Saturation Ratios: 19.2 % — ABNORMAL LOW (ref 20.0–50.0)
TIBC: 385 ug/dL (ref 250.0–450.0)
Transferrin: 275 mg/dL (ref 212.0–360.0)

## 2021-11-30 NOTE — Patient Instructions (Addendum)
If you are age 45 or younger, your body mass index should be between 19-25. Your Body mass index is 47.72 kg/m. If this is out of the aformentioned range listed, please consider follow up with your Primary Care Provider.  ________________________________________________________  The [facility] providers would like to encourage you to use [facility] to communicate with providers for non-urgent requests or questions.  Due to long hold times on the telephone, sending your provider a message by [facility] may be a faster and more efficient way to get a response.  Please allow 48 business hours for a response.  Please remember that this is for non-urgent requests.  _______________________________________________________  Your provider has requested that you go to the basement level for lab work before leaving today. Press "B" on the elevator. The lab is located at the first door on the left as you exit the elevator.  You are scheduled for 2nd Hemorrhoid banding on 01-03-22 at 10:50am.  Due to recent changes in healthcare laws, you may see the results of your imaging and laboratory studies on MyChart before your provider has had a chance to review them.  We understand that in some cases there may be results that are confusing or concerning to you. Not all laboratory results come back in the same time frame and the provider may be waiting for multiple results in order to interpret others.  Please give [place] 48 hours in order for your provider to thoroughly review all the results before contacting the office for clarification of your results.   HEMORRHOID BANDING PROCEDURE    FOLLOW-UP CARE   The procedure you have had should have been relatively painless since the banding of the area involved does not have nerve endings and there is no pain sensation.  The rubber band cuts off the blood supply to the hemorrhoid and the band may fall off as soon as 48 hours after the banding (the band may occasionally be seen in the  toilet bowl following a bowel movement). You may notice a temporary feeling of fullness in the rectum which should respond adequately to plain Tylenol or Motrin.  Following the banding, avoid strenuous exercise that evening and resume full activity the next day.  A sitz bath (soaking in a warm tub) or bidet is soothing, and can be useful for cleansing the area after bowel movements.     To avoid constipation, take two tablespoons of natural wheat bran, natural oat bran, flax, Benefiber or any over the counter fiber supplement and increase your water intake to 7-8 glasses daily.    Unless you have been prescribed anorectal medication, do not put anything inside your rectum for two weeks: No suppositories, enemas, fingers, etc.  Occasionally, you may have more bleeding than usual after the banding procedure.  This is often from the untreated hemorrhoids rather than the treated one.  Dont be concerned if there is a tablespoon or so of blood.  If there is more blood than this, lie flat with your bottom higher than your head and apply an ice pack to the area. If the bleeding does not stop within a half an hour or if you feel faint, call our office at [phone] or go to the emergency room.  Problems are not common; however, if there is a substantial amount of bleeding, severe pain, chills, fever or difficulty passing urine (very rare) or other problems, you should call us at [phone] or report to the nearest emergency room.  Do  not stay seated continuously for more than 2-3 hours for a day or two after the procedure.  Tighten your buttock muscles 10-15 times every two hours and take 10-15 deep breaths every 1-2 hours.  Do not spend more than a few minutes on the toilet if you cannot empty your bowel; instead re-visit the toilet at a later time.   Thank you for entrusting me with your care and choosing [facility].  Dr Lorenso Courier

## 2021-11-30 NOTE — Progress Notes (Signed)
Chief Complaint: Hemorrhoids, fatty liver  HPI : 46 year old female with history of asthma, fibromyalgia, OSA, obesity, GERD, anxiety, and hypothyroidism presents for follow up of hemorrhoids and fatty liver  Interval History: She has been doing really well since her colonoscopy procedure. She has not been having as much rectal bleeding. However, last week she developed another bleeding episode. With the rectal bleeding she has recurrence of some of the rectal pain and abdominal pain as well. She tried the Anusol suppository but had difficulty administering it. She does feel like the colonoscopy bowel preparation made a big difference in the qualify of her BMs. Her BMs are much more comfortable and easier to pass. She told her PCP that she had fatty liver. Thus her PCP has been working with her on weight loss strategies. Unfortunately she does not qualify for weight loss medications. She is drinks several cups of coffee per day. She works as a Human resources officer for [employer]. Denies alcohol use.  Current Outpatient Medications  Medication Sig Dispense Refill   cetirizine (ZYRTEC) 10 MG chewable tablet Chew by mouth.     Cholecalciferol 25 MCG (1000 UT) tablet Take by mouth.     fluticasone (FLONASE) 50 MCG/ACT nasal spray Place into the nose.     gabapentin (NEURONTIN) 300 MG capsule      HYDROcodone-acetaminophen (NORCO/VICODIN) 5-325 MG tablet Take 1 tablet by mouth every 4 (four) hours as needed. 10 tablet 0   hydrocortisone (ANUSOL-HC) 2.5 % rectal cream Place 1 application rectally 2 (two) times daily. 28 g 0   levothyroxine (SYNTHROID) 150 MCG tablet 1 tablet from Monday to Saturday. Take only 1/2 tablet every Sunday.     Lido-PE-Mineral Oil-Petrolatum (RECTICARE ADVANCED) 5-0.25-17-39 % CREA Use as needed 3 g 0   meloxicam (MOBIC) 15 MG tablet Take 15 mg by mouth daily.     Meth-Hyo-M Bl-Na Phos-Ph Sal (URIBEL) 118 MG CAPS Take by mouth.     omeprazole (PRILOSEC) 20 MG capsule Take by  mouth.     pentosan polysulfate (ELMIRON) 100 MG capsule Take by mouth.     No current facility-administered medications for this visit.   Review of Systems: All systems reviewed and negative except where noted in HPI.   Physical Exam: BP 110/80    Pulse 81    Ht 5\' 4"  (1.626 m)    Wt 278 lb (126.1 kg)    BMI 47.72 kg/m  Constitutional: Pleasant,well-developed, female in no acute distress. HEENT: Normocephalic and atraumatic. Conjunctivae are normal. No scleral icterus. Cardiovascular: Normal rate, regular rhythm.  Pulmonary/chest: Effort normal and breath sounds normal. No wheezing, rales or rhonchi. Abdominal: Soft, nondistended, nontender. Bowel sounds active throughout. There are no masses palpable. No hepatomegaly. Extremities: No edema Neurological: Alert and oriented to person place and time. Skin: Skin is warm and dry. No rashes noted. Psychiatric: Normal mood and affect. Behavior is normal.  Labs 10/2021: CMP with elevated ALT of 46. CBC unremarkable. Lipase nml.  FOBT positive  CTA A/P 10/30/21: IMPRESSION: VASCULAR No evidence of acute gastrointestinal hemorrhage. NON-VASCULAR 1. Severe hepatic steatosis. 2. No acute abdominopelvic abnormality  Colonoscopy 11/06/21: - Hemorrhoids found on perianal exam. - One 4 mm polyp in the transverse colon, removed with a cold snare. Resected and retrieved. - There was looping of the colon. - Internal hemorrhoids. - The examination was otherwise normal. Hemorrhoids are the cause of rectal bleeding. Patient had had difficulty placing lidocaine / steroid cream PR, would switch to anusol suppositories.  Patient is a candidate for hemorrhoid banding, given chronicity of symptoms this would be best for long term management  ASSESSMENT AND PLAN:  Hemorrhoids Lower abdominal pain Fatty liver Elevated AST Patient had some improvement in her bowel habits and hemorrhoids after undergoing a recent colonoscopy procedure. She has  continued to have some rectal bleeding and pain issues on occasion despite topical steroids. Thus we performed hemorrhoidal banding today to see if this will be a more durable therapy for her hemorrhoids. For her fatty liver and mildly elevated AST, will plan to rule out alternative causes of fatty liver. Patient si attempting to actively lose weight. - Patient is pursuing weight loss. Patient was not interested in nutrition consult at this time because she thinks that she knows what she has to do in terms of her nutrition - Check acute hepatitis panel, ANA, ASMA, IgG, iron/TIBC, ferritin - Hemorrhoidal banding today - RTC 1 month for further banding  Christia Reading, MD  PROCEDURE NOTE: The patient presents with symptomatic grade 2 hemorrhoids, requesting rubber band ligation of his/her hemorrhoidal disease.  All risks, benefits and alternative forms of therapy were described and informed consent was obtained.  In the Left Lateral Decubitus position anoscopic examination revealed grade 2 hemorrhoids  The anorectum was pre-medicated with Recticare and nitroglycerin The decision was made to band the posterior internal hemorrhoid, and the [place] was used to perform band ligation without complication.  Digital anorectal examination was then performed to assure proper positioning of the band, and to adjust the banded tissue as required.  The patient was discharged home without pain or other issues.  Dietary and behavioral recommendations were given and along with follow-up instructions.     The patient will return in 4 weeks for  follow-up and possible additional banding as required. No complications were encountered and the patient tolerated the procedure well.  I spent 63 minutes of time, including in depth chart review, independent review of results as outlined above, communicating results with the patient directly, face-to-face time with the patient, coordinating care, ordering studies  and medications as appropriate, and documentation.

## 2021-12-03 LAB — HEPATITIS A ANTIBODY, TOTAL: Hepatitis A AB,Total: REACTIVE — AB

## 2021-12-03 LAB — HEPATITIS C ANTIBODY
Hepatitis C Ab: NONREACTIVE
SIGNAL TO CUT-OFF: <0.02 (ref <1.00)

## 2021-12-03 LAB — ANA: Anti Nuclear Antibody (ANA): NEGATIVE

## 2021-12-03 LAB — HEPATITIS B SURFACE ANTIGEN: Hepatitis B Surface Ag: NONREACTIVE

## 2021-12-03 LAB — IGG: IgG (Immunoglobin G), Serum: 608 mg/dL (ref 600–1640)

## 2021-12-03 LAB — ANTI-SMOOTH MUSCLE ANTIBODY, IGG: Actin (Smooth Muscle) Antibody (IGG): <20 U (ref <20)

## 2021-12-03 LAB — HEPATITIS B SURFACE ANTIBODY,QUALITATIVE: Hep B S Ab: NONREACTIVE

## 2021-12-04 ENCOUNTER — Encounter: Payer: Self-pay | Admitting: Internal Medicine

## 2021-12-05 ENCOUNTER — Encounter: Payer: Self-pay | Admitting: Internal Medicine

## 2022-01-03 ENCOUNTER — Encounter: Payer: BC Managed Care – PPO | Admitting: Internal Medicine

## 2023-02-01 IMAGING — CT CT CTA ABD/PEL W/CM AND/OR W/O CM
3 of 9 series · 11 of 46 positions shown, 17 images · IV contrast (agent unspecified)
Comparison: 03/15/2021

CLINICAL DATA: 45-year-old female with history of abdominal pain
after episode of rectal bleeding.

EXAM:
CT ANGIOGRAPHY ABDOMEN AND PELVIS WITH CONTRAST AND WITHOUT CONTRAST
TECHNIQUE: Multidetector CT imaging of the abdomen and pelvis was performed
using the standard protocol during bolus administration of
intravenous contrast. Multiplanar reconstructed images and MIPs were
obtained and reviewed to evaluate the vascular anatomy.

[Series 5: axial arterial · axial · arterial · 0.98mm/px · z∈[-543,-468]mm · 2 of 175 slices shown]
[im 13/175  soft-tissue]
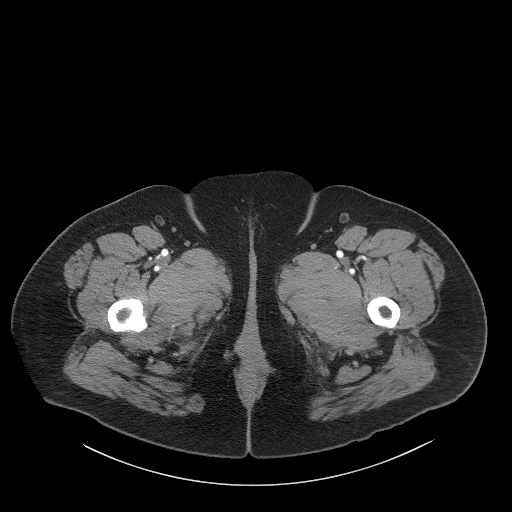
[im 38/175  soft-tissue]
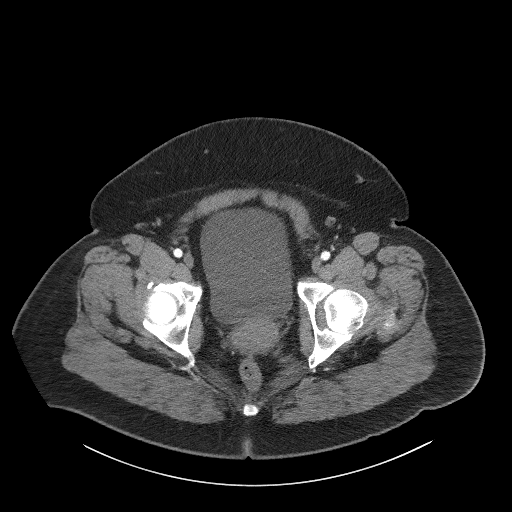

[Series 7: axial venous · axial · portal-venous · 0.98mm/px · z∈[-521,-136]mm · 7 of 103 slices shown, 12 images]
[im 13/103  soft-tissue]
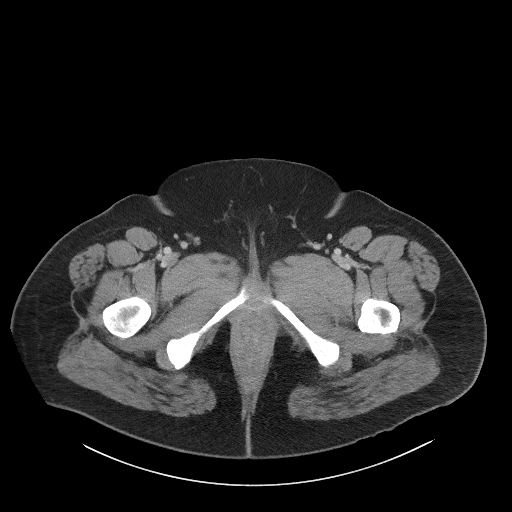
[im 13/103  bone]
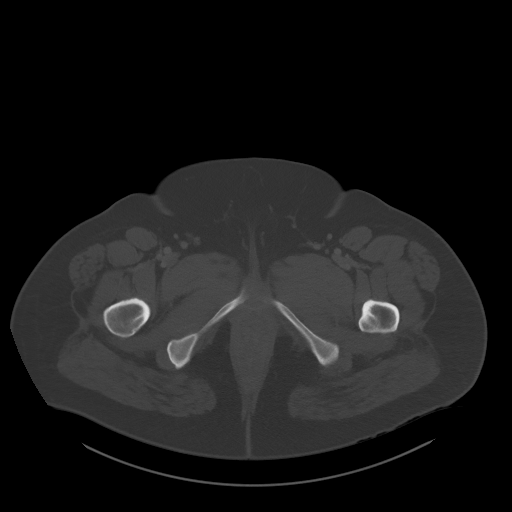
[im 26/103  soft-tissue]
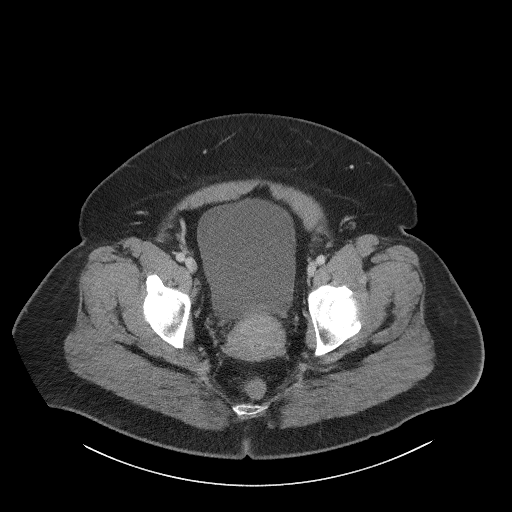
[im 39/103  soft-tissue]
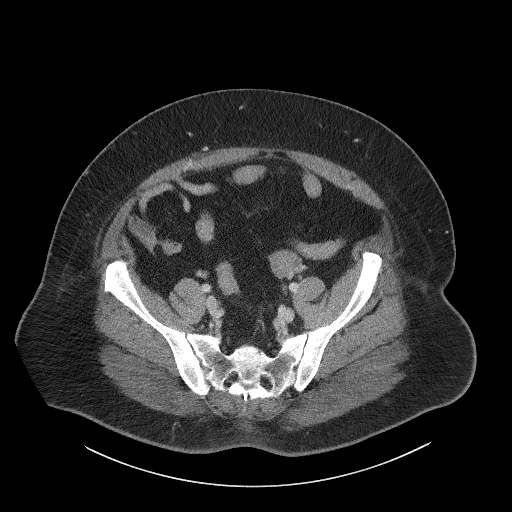
[im 52/103  soft-tissue]
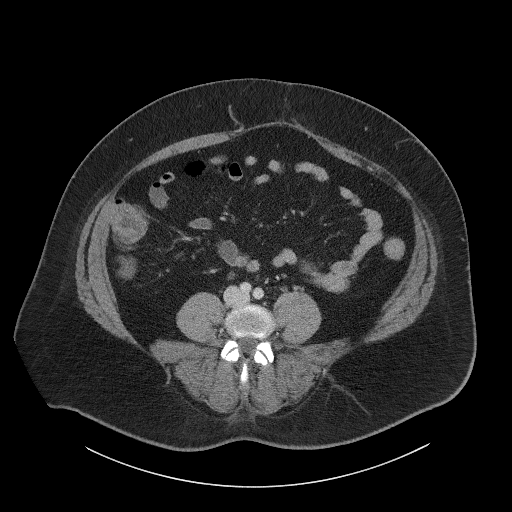
[im 52/103  lung]
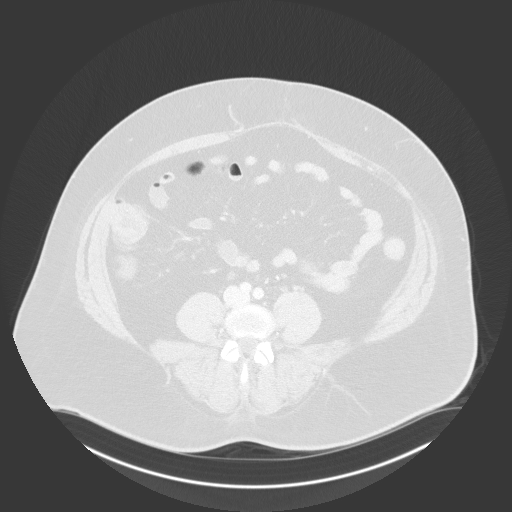
[im 64/103  soft-tissue]
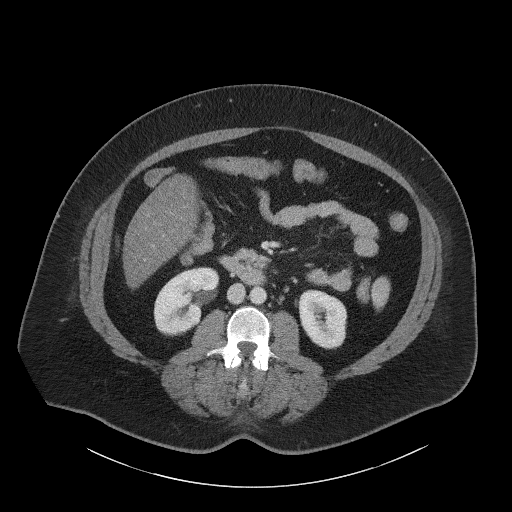
[im 64/103  lung]
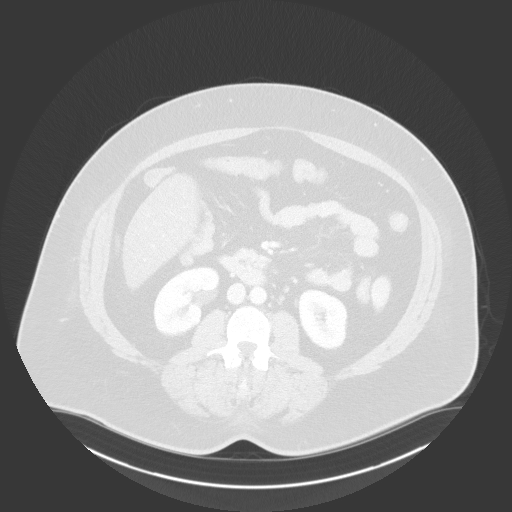
[im 77/103  soft-tissue]
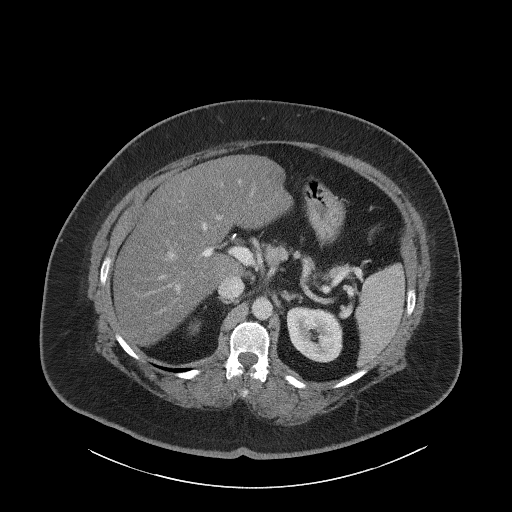
[im 77/103  lung]
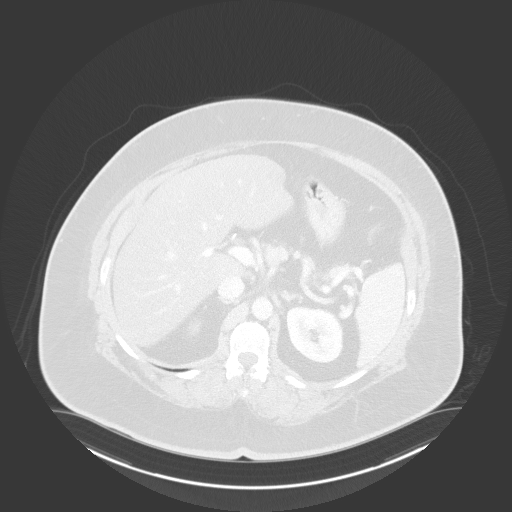
[im 90/103  soft-tissue]
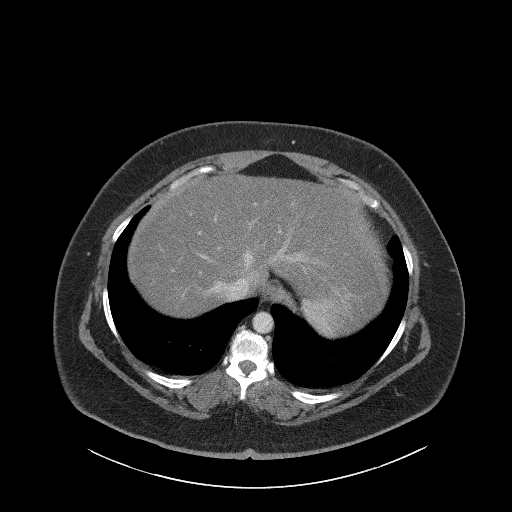
[im 90/103  lung]
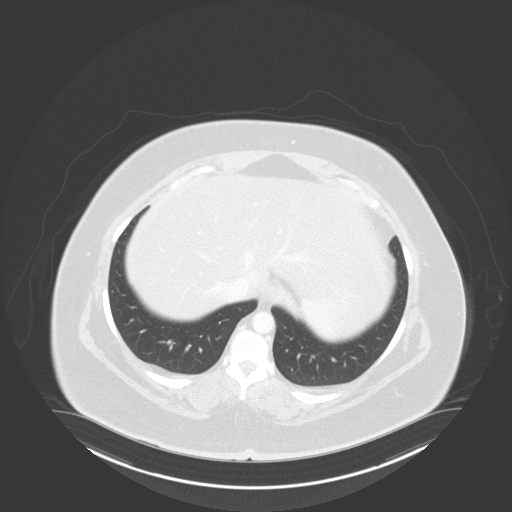

[Series 9: coronals · coronal · 0.86mm/px · 2 of 124 slices shown, 3 images]
[im 42/124  soft-tissue]
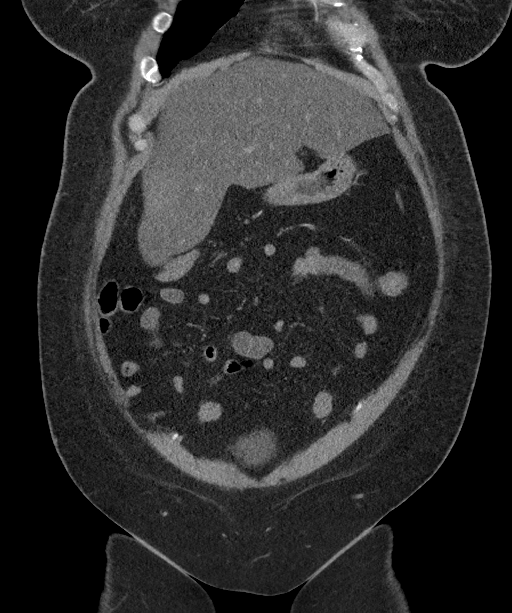
[im 42/124  bone]
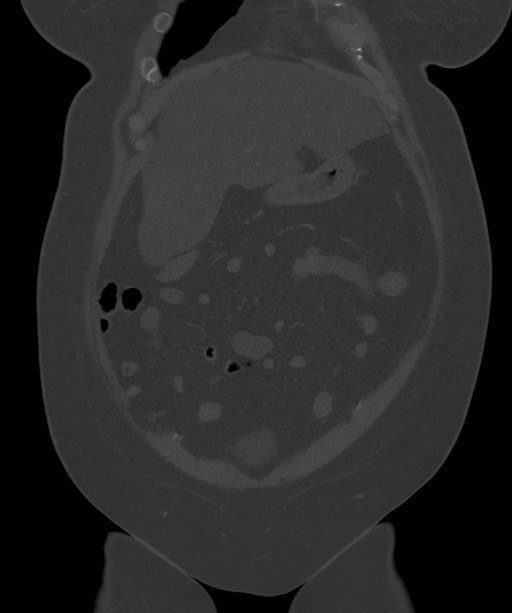
[im 83/124  soft-tissue]
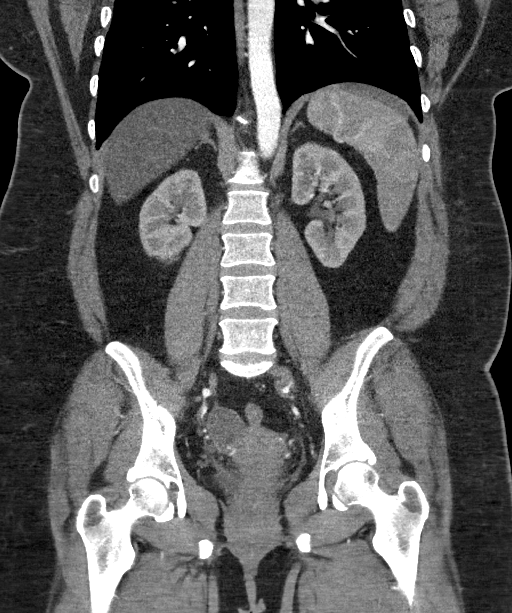

[11 of 46 positions shown; findings below may reference images not displayed]

RADIATION DOSE REDUCTION: This exam was performed according to the
departmental dose-optimization program which includes automated
exposure control, adjustment of the mA and/or kV according to
patient size and/or use of iterative reconstruction technique.

CONTRAST:  125mL OMNIPAQUE IOHEXOL 350 MG/ML SOLN
FINDINGS: VASCULAR

Aorta: Normal caliber aorta without aneurysm, dissection, vasculitis
or significant stenosis.

Celiac: Patent without evidence of aneurysm, dissection, vasculitis
or significant stenosis.

SMA: Patent without evidence of aneurysm, dissection, vasculitis or
significant stenosis.

Renals: Single bilateral renal arteries are patent without evidence
of aneurysm, dissection, vasculitis, fibromuscular dysplasia or
significant stenosis.

IMA: Patent without evidence of aneurysm, dissection, vasculitis or
significant stenosis.

Inflow: Patent without evidence of aneurysm, dissection, vasculitis
or significant stenosis.

Proximal Outflow: Bilateral common femoral and visualized portions
of the superficial and profunda femoral arteries are patent without
evidence of aneurysm, dissection, vasculitis or significant
stenosis.

Veins: The hepatic veins are widely patent. The portal system is
widely patent and normal in caliber. The renal veins are patent
bilaterally in standard anatomic configuration. No evidence of
iliocaval thrombosis or anomaly.

Review of the MIP images confirms the above findings.

NON-VASCULAR

Lower chest: No acute abnormality.

Hepatobiliary: Profound lead decreased attenuation of the hepatic
parenchyma. Smooth contour. No evidence of hepatoma. The gallbladder
surgically absent.

Pancreas: Unremarkable. No pancreatic ductal dilatation or
surrounding inflammatory changes.

Spleen: No splenic injury or perisplenic hematoma.

Adrenals/Urinary Tract: Adrenal glands are unremarkable. Kidneys are
normal, without renal calculi, focal lesion, or hydronephrosis.
Bladder is unremarkable.

Stomach/Bowel: Stomach is within normal limits. Appendix appears
normal. No evidence of bowel wall thickening, distention, or
inflammatory changes.

Lymphatic: No abdominopelvic lymphadenopathy.

Reproductive: Uterus and bilateral adnexa are within normal limits.
Intrauterine device in place which appears well positioned.

Other: No abdominal wall hernia or abnormality. No abdominopelvic
ascites.

Musculoskeletal: No acute or significant osseous findings.
IMPRESSION: VASCULAR

No evidence of acute gastrointestinal hemorrhage.

NON-VASCULAR

1. Severe hepatic steatosis.
2. No acute abdominopelvic abnormality.
